# Patient Record
Sex: Male | Born: 1990
Health system: Southern US, Community
[De-identification: ages and names within clinical notes are randomized; demographics above are authoritative.]

---

## 2015-11-30 ENCOUNTER — Encounter (HOSPITAL_BASED_OUTPATIENT_CLINIC_OR_DEPARTMENT_OTHER): Payer: Self-pay

## 2015-11-30 ENCOUNTER — Emergency Department (HOSPITAL_BASED_OUTPATIENT_CLINIC_OR_DEPARTMENT_OTHER)
Admission: EM | Admit: 2015-11-30 | Discharge: 2015-12-01 | Disposition: A | Discharge status: Home / Self Care | Payer: Self-pay | Attending: Emergency Medicine | Admitting: Emergency Medicine

## 2015-11-30 ENCOUNTER — Emergency Department (HOSPITAL_BASED_OUTPATIENT_CLINIC_OR_DEPARTMENT_OTHER): Payer: Self-pay

## 2015-11-30 DIAGNOSIS — Y92008 Other place in unspecified non-institutional (private) residence as the place of occurrence of the external cause: Secondary | ICD-10-CM | POA: Insufficient documentation

## 2015-11-30 DIAGNOSIS — Y9389 Activity, other specified: Secondary | ICD-10-CM | POA: Insufficient documentation

## 2015-11-30 DIAGNOSIS — F17200 Nicotine dependence, unspecified, uncomplicated: Secondary | ICD-10-CM | POA: Insufficient documentation

## 2015-11-30 DIAGNOSIS — S82831A Other fracture of upper and lower end of right fibula, initial encounter for closed fracture: Secondary | ICD-10-CM | POA: Insufficient documentation

## 2015-11-30 DIAGNOSIS — W1839XA Other fall on same level, initial encounter: Secondary | ICD-10-CM | POA: Insufficient documentation

## 2015-11-30 DIAGNOSIS — Y998 Other external cause status: Secondary | ICD-10-CM | POA: Insufficient documentation

## 2015-11-30 DIAGNOSIS — S82401A Unspecified fracture of shaft of right fibula, initial encounter for closed fracture: Secondary | ICD-10-CM

## 2015-11-30 MED ORDER — OXYCODONE-ACETAMINOPHEN 5-325 MG PO TABS
1.0000 | ORAL_TABLET | Freq: Once | ORAL | Status: AC
  Administered 2015-11-30: 1 via ORAL
  Filled 2015-11-30: qty 1

## 2015-11-30 MED ORDER — OXYCODONE-ACETAMINOPHEN 5-325 MG PO TABS: 2.0000 | ORAL_TABLET | ORAL | Status: DC | PRN

## 2015-11-30 NOTE — Discharge Instructions (Signed)
Fibular Ankle Fracture Treated With or Without Immobilization, Adult A fibular fracture at your ankle is a break (fracture) bone in the smallest of the two bones in your lower leg, located on the outside of your leg (fibula) close to the area at your ankle joint. CAUSES  Rolling your ankle.  Twisting your ankle.  Extreme flexing or extending of your foot.  Severe force on your ankle as when falling from a distance. RISK FACTORS  Jumping activities.  Participation in sports.  Osteoporosis.  Advanced age.  Previous ankle injuries. SIGNS AND SYMPTOMS  Pain.  Swelling.  Inability to put weight on injured ankle.  Bruising.  Bone deformities at site of injury. DIAGNOSIS  This fracture is diagnosed with the help of an X-ray exam. TREATMENT  If the fractured bone did not move out of place it usually will heal without problems and does casting or splinting. If immobilization is needed for comfort or the fractured bone moved out of place and will not heal properly with immobilization, a cast or splint will be used. HOME CARE INSTRUCTIONS   Apply ice to the area of injury:  Put ice in a plastic bag.  Place a towel between your skin and the bag.  Leave the ice on for 20 minutes, 2-3 times a day.  Use crutches as directed. Resume walking without crutches as directed by your health care provider.  Only take over-the-counter or prescription medicines for pain, discomfort, or fever as directed by your health care provider.  If you have a removable splint or boot, do not remove the boot unless directed by your health care provider. SEEK MEDICAL CARE IF:   You have continued pain or more swelling  The medications do not control the pain. SEEK IMMEDIATE MEDICAL CARE IF:  You develop severe pain in the leg or foot.  Your skin or nails below the injury turn blue or grey or feel cold or numb. MAKE SURE YOU:   Understand these instructions.  Will watch your  condition.  Will get help right away if you are not doing well or get worse.   This information is not intended to replace advice given to you by your health care provider. Make sure you discuss any questions you have with your health care provider.   Document Released: 11/26/2005 Document Revised: 12/17/2014 Document Reviewed: 07/08/2013 Elsevier Interactive Patient Education 2016 [employer]  Fibular Fracture With Rehab The fibula is the smaller of the two lower leg bones and is vulnerable to breaks (fracture). Fibular fractures may go fully through the bone (complete) or partially (incomplete). The bone fragments are rarely out of alignment (displaced fracture). Fibula fractures may occur anywhere along the bone. However, this document only discusses fractures that do not involve a leg joint. Fibular fractures are not often a severe injury because the bone supports only about 17% of the body weight. SYMPTOMS   Moderate to severe pain in the lower leg.  Tenderness and swelling in the leg or calf.  Bleeding and/or bruising (contusion) in the leg.  Inability to bear weight on the injured extremity.  Visible deformity, if the fracture is displaced.  Numbness and coldness in the leg and foot, beyond the fracture site, if blood supply is impaired. CAUSES  Fractures occur when a force is placed on the bone that is greater than it can withstand. Common causes of fibular fracture include:  Direct hit (trauma) (i.e., hockey or lacrosse check to the lower leg).  Stress fracture (weakening of  the bone from repeated stress).  Indirect injury, caused by twisting, turning quickly, or violent muscle contraction. RISK INCREASES WITH:  Contact sports (i.e., football, soccer, lacrosse, hockey).  Sports that can cause twisted ankle injury (i.e., skiing, basketball).  Bony abnormalities (i.e., osteoporosis or bone tumors).  Metabolism disorders, hormone problems, and nutrition deficiency and  disorders (i.e., anorexia and bulimia).  Poor strength and flexibility. PREVENTION   Warm up and stretch properly before activity.  Maintain physical fitness:  Strength, flexibility, and endurance.  Cardiovascular fitness.  Wear properly fitted and padded protective equipment (i.e., shin guards for soccer). PROGNOSIS  If treated properly, fibular fractures usually heal in 4 to 6 weeks.  RELATED COMPLICATIONS   Failure of bone to heal (nonunion).  Bone heals in a poor position (malunion).  Increased pressure inside the leg (compartment syndrome) due to injury that disrupts the blood supply to the leg and foot and injures the nerves and muscles (uncommon).  Shortening of the injured bones.  Hindrance of normal bone growth in children.  Risks of surgery: infection, bleeding, injury to nerves (numbness, weakness, paralysis), need for further surgery.  Longer healing time if activity is resumed too soon. TREATMENT Treatment first involves ice, medicine, and elevation of the leg to reduce pain and inflammation. People with fibular fractures are advised to walk using crutches. A brace or walking boot may be given to restrain the injured leg and allow for healing. Sometimes, surgery is needed to place a rod, plate, or screws in the bones in order to fix the fracture. After surgery, the leg is restrained. After restraint (with or without surgery), it is important to complete strengthening and stretching exercises to regain strength and a full range of motion. Exercises may be completed at home or with a therapist. MEDICATION   If pain medicine is needed, nonsteroidal anti-inflammatory medicines (aspirin and ibuprofen), or other minor pain relievers (acetaminophen), are often advised.  Do not take pain medicine for 7 days before surgery.  Prescription pain relievers may be given if your health care provider thinks they are needed. Use only as directed and only as much as you need. SEEK  MEDICAL CARE IF:  Symptoms get worse or do not improve in 2 weeks, despite treatment.  The following occur after restraint or surgery. (Report any of these signs immediately):  Swelling above or below the fracture site.  Severe, persistent pain.  Blue or gray skin below the fracture site, especially under the toenails. Numbness or loss of feeling below the fracture site.  New, unexplained symptoms develop. (Drugs used in treatment may produce side effects.) EXERCISES  RANGE OF MOTION (ROM) AND STRETCHING EXERCISES - Fibular Fracture These exercises may help you when beginning to recover from your injury. Your symptoms may go away with or without further involvement from your physician, physical therapist or athletic trainer. While completing these exercises, remember:   Restoring tissue flexibility helps normal motion to return to the joints. This allows healthier, less painful movement and activity.  An effective stretch should be held for at least 30 seconds.  A stretch should never be painful. You should only feel a gentle lengthening or release in the stretched tissue. RANGE OF MOTION - Dorsi/Plantar Flexion  While sitting with your right / left knee straight, draw the top of your foot upwards by flexing your ankle. Then reverse the motion, pointing your toes downward.  Hold each position for __________ seconds.  After completing your first set of exercises, repeat this exercise with  your knee bent. Repeat __________ times. Complete this exercise __________ times per day.  STRETCH - Gastrocsoleus   Sit with your right / left leg extended. Holding onto both ends of a belt or towel, loop it around the ball of your foot.  Keeping your right / left ankle and foot relaxed and your knee straight, pull your foot and ankle toward you using the belt.  You should feel a gentle stretch behind your calf or knee. Hold this position for __________ seconds. Repeat __________ times. Complete  this stretch __________ times per day.  RANGE OF MOTION- Ankle Plantar Flexion   Sit with your right / left leg crossed over your opposite knee.  Use your opposite hand to pull the top of your foot and toes toward you.  You should feel a gentle stretch on the top of your foot and ankle. Hold this position for __________ seconds. Repeat __________ times. Complete __________ times per day.  RANGE OF MOTION - Ankle Eversion  Sit with your right / left ankle crossed over your opposite knee.  Grip your foot with your opposite hand, placing your thumb on the top of your foot and your fingers across the bottom of your foot.  Gently push your foot downward with a slight rotation so your littlest toes rise slightly toward the ceiling.  You should feel a gentle stretch on the inside of your ankle. Hold the stretch for __________ seconds. Repeat __________ times. Complete this exercise __________ times per day.  RANGE OF MOTION - Ankle Inversion  Sit with your right / left ankle crossed over your opposite knee.  Grip your foot with your opposite hand, placing your thumb on the bottom of your foot and your fingers across the top of your foot.  Gently pull your foot so the smallest toe comes toward you and your thumb pushes the inside of the ball of your foot away from you.  You should feel a gentle stretch on the outside of your ankle. Hold the stretch for __________ seconds. Repeat __________ times. Complete this exercise __________ times per day.  RANGE OF MOTION - Ankle Alphabet  Imagine your right / left big toe is a pen.  Keeping your hip and knee still, write out the entire alphabet with your "pen." Make the letters as large as you can, without increasing any discomfort. Repeat __________ times. Complete this exercise __________ times per day.  RANGE OF MOTION - Ankle Dorsiflexion, Active Assisted  Remove your shoes and sit on a chair, preferably not on a carpeted surface.  Place  your right / left foot on the floor, directly under your knee. Extend your opposite leg for support.  Keeping your heel down, slide your right / left foot back toward the chair, until you feel a stretch at your ankle or calf. If you do not feel a stretch, slide your bottom forward to the edge of the chair, while still keeping your heel down.  Hold this stretch for __________ seconds. Repeat __________ times. Complete this stretch __________ times per day.  STRENGTHENING EXERCISES - Fibular Fracture These exercises may help you when beginning to recover from your injury. They may resolve your symptoms with or without further involvement from your physician, physical therapist or athletic trainer. While completing these exercises, remember:   Muscles can gain both the endurance and the strength needed for everyday activities through controlled exercises.  Complete these exercises as instructed by your physician, physical therapist or athletic trainer. Increase the resistance  and repetitions only as guided.  You may experience muscle soreness or fatigue, but the pain or discomfort you are trying to eliminate should never worsen during these exercises. If this pain does get worse, stop and make certain you are following the directions exactly. If the pain is still present after adjustments, discontinue the exercise until you can discuss the trouble with your clinician. STRENGTH - Dorsiflexors  Secure a rubber exercise band or tubing to a fixed object (table, pole) and loop the other end around your right / left foot.  Sit on the floor, facing the fixed object. The band should be slightly tense when your foot is relaxed.  Slowly draw your foot back toward you, using your ankle and toes.  Hold this position for __________ seconds. Slowly release the tension in the band and return your foot to the starting position. Repeat __________ times. Complete this exercise __________ times per day.  STRENGTH  - Plantar-flexors  Sit with your right / left leg extended. Holding onto both ends of a rubber exercise band or tubing, loop it around the ball of your foot. Keep a slight tension in the band.  Slowly push your toes away from you, pointing them downward.  Hold this position for __________ seconds. Return to the starting position slowly, controlling the tension in the band. Repeat __________ times. Complete this exercise __________ times per day.  STRENGTH - Plantar-flexors, Standing   Stand with your feet shoulder width apart. Place your hands on a wall or table to steady yourself, using as little support as needed.  Keeping your weight evenly spread over the width of your feet, rise up on your toes.*  Hold this position for __________ seconds. Repeat __________ times. Complete this exercise __________ times per day.  *If this is too easy, shift your weight toward your right / left leg until you feel challenged. Ultimately, you may be asked to do this exercise while standing on your right / left foot only. STRENGTH - Towel Curls  Sit in a chair, on a non-carpeted surface.  Place your foot on a towel, keeping your heel on the floor.  Pull the towel toward your heel only by curling your toes. Keep your heel on the floor.  If instructed by your physician, physical therapist or athletic trainer, add ____________________ at the end of the towel. Repeat __________ times. Complete this exercise __________ times per day. STRENGTH - Ankle Eversion  Secure one end of a rubber exercise band or tubing to a fixed object (table, pole). Loop the other end around your foot, just before your toes.  Place your fists between your knees. This will focus your strengthening at your ankle.  Drawing the band across your opposite foot, away from the pole, slowly pull your little toe out and up. Make sure the band is positioned to resist the entire motion.  Hold this position for __________  seconds.  Return to the starting position slowly, controlling the tension in the band. Repeat __________ times. Complete this exercise __________ times per day.  STRENGTH - Ankle Inversion  Secure one end of a rubber exercise band or tubing to a fixed object (table, pole). Loop the other end around your foot, just before your toes.  Place your fists between your knees. This will focus your strengthening at your ankle.  Slowly, pull your big toe up and in, making sure the band is positioned to resist the entire motion.  Hold this position for __________ seconds.  Return to  the starting position slowly, controlling the tension in the band. Repeat __________ times. Complete this exercises __________ times per day.    This information is not intended to replace advice given to you by your health care provider. Make sure you discuss any questions you have with your health care provider.   Follow-up with orthopedic provider as soon as possible for reevaluation. Do not bear weight on this extremity. Keep leg elevated as much as possible. Apply ice to the affected area. Take ibuprofen for pain and inflammation. Return to the emergency per review experience severe worsening of your pain, discoloration or extremity, numbness or tingling.

## 2015-11-30 NOTE — ED Provider Notes (Signed)
CSN: [id number]     Arrival date & time 11/30/15  1548 History   First MD Initiated Contact with Patient 11/30/15 1632     Chief Complaint  Patient presents with  . Leg Pain     (Consider location/radiation/quality/duration/timing/severity/associated sxs/prior Treatment) HPI   Darryl Pruitt is a 24 y.o M with no significant pmhx who presents the emergency room today complaining of right lower leg pain. Patient states that 2 nights ago he was on his back porch when he pivoted on his right foot to go back inside and he felt immediate onset pain in his right lower extremity and fell to the ground. Patient was initially able to bear weight on this leg but it has become more painful over the last 2 days. He is now unable to bear weight on that leg. Patient reports significant bruising and swelling to his right ankle. Pain is primarily located below his right knee on the lateral aspect of his anterior shin. Patient has been taking Vicoprofen with no relief. Denies numbness or tingling in his extremity, cool or pale extremity, weakness.   History reviewed. No pertinent past medical history. History reviewed. No pertinent past surgical history. No family history on file. Social History  Substance Use Topics  . Smoking status: Current Every Day Smoker  . Smokeless tobacco: None  . Alcohol Use: Yes     Comment: social    Review of Systems  All other systems reviewed and are negative.     Allergies  Review of patient's allergies indicates no known allergies.  Home Medications   Prior to Admission medications   Medication Sig Start Date End Date Taking? Authorizing Provider  oxyCODONE-acetaminophen (PERCOCET/ROXICET) 5-325 MG tablet Take 2 tablets by mouth every 4 (four) hours as needed for severe pain. 11/30/15   Kayla Deshaies Tripp Ireanna Finlayson, PA-C   BP 123/80 mmHg  Pulse 68  Temp(Src) 98.4 F (36.9 C) (Oral)  Resp 18  Ht 5\' 7"  (1.702 m)  Wt 68.04 kg  BMI 23.49 kg/m2  SpO2  100% Physical Exam  Constitutional: He is oriented to person, place, and time. He appears well-developed and well-nourished. No distress.  HENT:  Head: Normocephalic and atraumatic.  Eyes: Conjunctivae are normal. Right eye exhibits no discharge. Left eye exhibits no discharge. No scleral icterus.  Cardiovascular: Normal rate and intact distal pulses.   Pulmonary/Chest: Effort normal.  Musculoskeletal:       Right lower leg: He exhibits tenderness and swelling.       Legs: Significant edema and ecchymosis over right lateral malleolus. Exquisite TTP over anterior lateral aspect of right shin. Pain felt with dorsi and plantarflexion of right foot.  Neurological: He is alert and oriented to person, place, and time. He exhibits normal muscle tone. Coordination normal.  Strength 5/5 throughout. No sensory deficits.    Skin: Skin is warm and dry. No rash noted. He is not diaphoretic. No erythema. No pallor.  Psychiatric: He has a normal mood and affect. His behavior is normal.  Nursing note and vitals reviewed.   ED Course  Procedures (including critical care time) Labs Review Labs Reviewed - No data to display  Imaging Review Dg Tibia/fibula Right  11/30/2015  CLINICAL DATA:  Injured right ankle and leg 2 days ago. EXAM: RIGHT ANKLE - COMPLETE 3+ VIEW; RIGHT TIBIA AND FIBULA - 2 VIEW COMPARISON:  None. FINDINGS: Right tibia/ fibula: There is a nondisplaced oblique fracture involving the proximal fibular shaft. The tibia is intact. The knee joint is  normal. Right ankle: The ankle mortise is maintained. No acute ankle fracture or osteochondral abnormality. No joint effusion. The visualized mid and hindfoot bony structures are intact. IMPRESSION: Proximal fibular shaft fracture. No ankle fracture. Electronically Signed   By: Rudie Meyer M.D.   On: 11/30/2015 18:15   Dg Ankle Complete Right  11/30/2015  CLINICAL DATA:  Injured right ankle and leg 2 days ago. EXAM: RIGHT ANKLE - COMPLETE 3+  VIEW; RIGHT TIBIA AND FIBULA - 2 VIEW COMPARISON:  None. FINDINGS: Right tibia/ fibula: There is a nondisplaced oblique fracture involving the proximal fibular shaft. The tibia is intact. The knee joint is normal. Right ankle: The ankle mortise is maintained. No acute ankle fracture or osteochondral abnormality. No joint effusion. The visualized mid and hindfoot bony structures are intact. IMPRESSION: Proximal fibular shaft fracture. No ankle fracture. Electronically Signed   By: Rudie Meyer M.D.   On: 11/30/2015 18:15   I have personally reviewed and evaluated these images and lab results as part of my medical decision-making.   EKG Interpretation None      MDM   Final diagnoses:  Fibula fracture, right, closed, initial encounter    Pt with R proximal fibular shaft fracture. No ankle fracture. Neurovascularly intact. Will place pt in knee immobilizer as this will stabilize his fibular fracture, and give crutches. Non-weight-bearing. F/u with orthopedic. Recommend RICE precautions and ibuprofen. Will give short course of pain medication.     Lester Kinsman Coin, PA-C 11/30/15 2221  Rolland Porter, MD 12/13/15 [phone]

## 2015-11-30 NOTE — ED Notes (Signed)
C/o pain to right LE x 2 days with no known injury

## 2015-12-07 ENCOUNTER — Ambulatory Visit (INDEPENDENT_AMBULATORY_CARE_PROVIDER_SITE_OTHER): Payer: Self-pay | Admitting: Family Medicine

## 2015-12-07 ENCOUNTER — Encounter: Payer: Self-pay | Admitting: Family Medicine

## 2015-12-07 VITALS — BP 114/72 | HR 82 | Ht 70.0 in | Wt 150.0 lb

## 2015-12-07 DIAGNOSIS — S82401A Unspecified fracture of shaft of right fibula, initial encounter for closed fracture: Secondary | ICD-10-CM

## 2015-12-07 MED ORDER — IBUPROFEN 800 MG PO TABS: 800.0000 mg | ORAL_TABLET | Freq: Three times a day (TID) | ORAL | Status: AC | PRN

## 2015-12-07 MED ORDER — HYDROCODONE-ACETAMINOPHEN 10-325 MG PO TABS: 1.0000 | ORAL_TABLET | Freq: Four times a day (QID) | ORAL | Status: DC | PRN

## 2015-12-07 NOTE — Patient Instructions (Signed)
You have a fibular shaft fracture. Wear immobilizer at all times except to ice and wash the area. Elevate above your heart when possible. Icing 15 minutes at a time 3-4 times a day. Ibuprofen 800mg  three times a day with food. Norco as needed for severe pain (no driving on this medication). Follow up with me in 1 week for reevaluation. If you develop numbness in your foot, pain worsens instead of improves over the next several days, call us.

## 2015-12-09 DIAGNOSIS — S82401A Unspecified fracture of shaft of right fibula, initial encounter for closed fracture: Secondary | ICD-10-CM | POA: Insufficient documentation

## 2015-12-09 NOTE — Progress Notes (Signed)
PCP: No primary care provider on file.  Subjective:   HPI: Patient is a 24 y.o. male here for right leg injury.  Patient reports on 12/19 he turned to open the door off back deck, fell down to his knees. Severe pain lateral right lower leg. Swelling occurred down into ankle and foot. Has had a lot of pain in lower leg and calf. No swelling, redness, warmth of calf. No tingling or numbness, weakness of lower leg. Pain level 7/10, sharp. Wearing knee immobilizer. Then 2 days ago he was improving and fell down 2-3 steps with immobilizer on - thinks this contributed to current pain. Taking pain medicine, ibuprofen.  No past medical history on file.  Current Outpatient Prescriptions on File Prior to Visit  Medication Sig Dispense Refill  . oxyCODONE-acetaminophen (PERCOCET/ROXICET) 5-325 MG tablet Take 2 tablets by mouth every 4 (four) hours as needed for severe pain. 10 tablet 0   No current facility-administered medications on file prior to visit.    No past surgical history on file.  No Known Allergies  Social History   Social History  . Marital Status: Single    Spouse Name: N/A  . Number of Children: N/A  . Years of Education: N/A   Occupational History  . Not on file.   Social History Main Topics  . Smoking status: Current Every Day Smoker  . Smokeless tobacco: Not on file  . Alcohol Use: 0.0 oz/week    0 Standard drinks or equivalent per week     Comment: social  . Drug Use: No  . Sexual Activity: Not on file   Other Topics Concern  . Not on file   Social History Narrative    No family history on file.  BP 114/72 mmHg  Pulse 82  Ht 5\' 10"  (1.778 m)  Wt 150 lb (68.04 kg)  BMI 21.52 kg/m2  Review of Systems: See HPI above.    Objective:  Physical Exam:  Gen: NAD  Right lower leg: Mild swelling lower leg, ankle, foot.  No bruising, warmth, palpable cords.  Calf soft.  TTP proximal fibula and lateral gastroc.  No malleolar, base 5th, deltoid  ligament, navicular, other tenderness. Minimal limitation ankle motions without pain.  5/5 strength dorsiflexion. Thompsons negative. Syndesmotic compression negative. NVI distally.  Sensation intact entire foot to light touch.  Left ankle: FROM without pain.    Assessment & Plan:  1. Right fibular shaft fracture - independently reviewed radiographs.  Ankle joint symmetric, no evidence maisonneuve fracture.  Calf soft, no evidence compartment syndrome based on history, exam.  Will continue with immobilizer.  Icing, ibuprofen with norco as needed.  F/u in 1 week for reevaluation.

## 2015-12-09 NOTE — Assessment & Plan Note (Signed)
independently reviewed radiographs.  Ankle joint symmetric, no evidence maisonneuve fracture.  Calf soft, no evidence compartment syndrome based on history, exam.  Will continue with immobilizer.  Icing, ibuprofen with norco as needed.  F/u in 1 week for reevaluation.

## 2015-12-14 ENCOUNTER — Ambulatory Visit (HOSPITAL_BASED_OUTPATIENT_CLINIC_OR_DEPARTMENT_OTHER)
Admission: RE | Admit: 2015-12-14 | Discharge: 2015-12-14 | Disposition: A | Discharge status: Home / Self Care | Payer: Self-pay | Source: Ambulatory Visit | Attending: Family Medicine | Admitting: Family Medicine

## 2015-12-14 ENCOUNTER — Ambulatory Visit (INDEPENDENT_AMBULATORY_CARE_PROVIDER_SITE_OTHER): Payer: Self-pay | Admitting: Family Medicine

## 2015-12-14 ENCOUNTER — Encounter: Payer: Self-pay | Admitting: Family Medicine

## 2015-12-14 ENCOUNTER — Other Ambulatory Visit: Payer: Self-pay | Admitting: Family Medicine

## 2015-12-14 VITALS — BP 117/73 | HR 82 | Ht 70.0 in | Wt 150.0 lb

## 2015-12-14 DIAGNOSIS — M25571 Pain in right ankle and joints of right foot: Secondary | ICD-10-CM

## 2015-12-14 DIAGNOSIS — S82401D Unspecified fracture of shaft of right fibula, subsequent encounter for closed fracture with routine healing: Secondary | ICD-10-CM

## 2015-12-14 MED ORDER — HYDROCODONE-ACETAMINOPHEN 10-325 MG PO TABS: 1.0000 | ORAL_TABLET | Freq: Four times a day (QID) | ORAL | Status: DC | PRN

## 2015-12-14 NOTE — Patient Instructions (Signed)
Continue with the knee immobilizer and crutches. Ibuprofen three times a day with food. Hydrocodone as needed for severe pain. I would recommend avoiding weight bearing on this until I see you back. Out of work in the meantime. Follow up with me in 2 weeks.

## 2015-12-16 NOTE — Progress Notes (Signed)
PCP: No primary care provider on file.  Subjective:   HPI: Patient is a 25 y.o. male here for right leg injury.  12/07/15: Patient reports on 12/19 he turned to open the door off back deck, fell down to his knees. Severe pain lateral right lower leg. Swelling occurred down into ankle and foot. Has had a lot of pain in lower leg and calf. No swelling, redness, warmth of calf. No tingling or numbness, weakness of lower leg. Pain level 7/10, sharp. Wearing knee immobilizer. Then 2 days ago he was improving and fell down 2-3 steps with immobilizer on - thinks this contributed to current pain. Taking pain medicine, ibuprofen.  12/14/15: Patient reports pain has improved since last visit - down to 6/10 level. Taking ibuprofen and hydrocodone. Using immobilizer. Difficulty sleeping at night.  No past medical history on file.  Current Outpatient Prescriptions on File Prior to Visit  Medication Sig Dispense Refill  . ibuprofen (ADVIL,MOTRIN) 800 MG tablet Take 1 tablet (800 mg total) by mouth every 8 (eight) hours as needed. 90 tablet 1   No current facility-administered medications on file prior to visit.    No past surgical history on file.  No Known Allergies  Social History   Social History  . Marital Status: Single    Spouse Name: N/A  . Number of Children: N/A  . Years of Education: N/A   Occupational History  . Not on file.   Social History Main Topics  . Smoking status: Current Every Day Smoker  . Smokeless tobacco: Not on file  . Alcohol Use: 0.0 oz/week    0 Standard drinks or equivalent per week     Comment: social  . Drug Use: No  . Sexual Activity: Not on file   Other Topics Concern  . Not on file   Social History Narrative    No family history on file.  BP 117/73 mmHg  Pulse 82  Ht 5\' 10"  (1.778 m)  Wt 150 lb (68.04 kg)  BMI 21.52 kg/m2  Review of Systems: See HPI above.    Objective:  Physical Exam:  Gen: NAD  Right lower leg: Mild  swelling lower leg, ankle, foot.  No bruising, warmth, palpable cords.  Calf soft.  TTP proximal fibula and lateral gastroc.  Mild medial tenderness now over deltoid ligament.  No malleolar, base 5th, navicular, other tenderness. Minimal limitation ankle motions without pain.  5/5 strength dorsiflexion. Thompsons negative. Syndesmotic compression negative. NVI distally.  Sensation intact entire foot to light touch.  Left ankle: FROM without pain.    Assessment & Plan:  1. Right fibular shaft fracture - independently reviewed radiographs - performed medial stress view today.  Ankle joint symmetric, no evidence maisonneuve fracture.  Calf soft, no evidence compartment syndrome based on history, exam.  Will continue with immobilizer.  Icing, ibuprofen with norco as needed.  Avoid weight bearing.  F/u in 2 weeks for reevaluation.

## 2015-12-16 NOTE — Assessment & Plan Note (Signed)
independently reviewed radiographs - performed medial stress view today.  Ankle joint symmetric, no evidence maisonneuve fracture.  Calf soft, no evidence compartment syndrome based on history, exam.  Will continue with immobilizer.  Icing, ibuprofen with norco as needed.  Avoid weight bearing.  F/u in 2 weeks for reevaluation.

## 2015-12-28 ENCOUNTER — Ambulatory Visit: Payer: Self-pay | Admitting: Family Medicine

## 2015-12-30 ENCOUNTER — Encounter: Payer: Self-pay | Admitting: Family Medicine

## 2015-12-30 ENCOUNTER — Ambulatory Visit (HOSPITAL_BASED_OUTPATIENT_CLINIC_OR_DEPARTMENT_OTHER)
Admission: RE | Admit: 2015-12-30 | Discharge: 2015-12-30 | Disposition: A | Discharge status: Home / Self Care | Payer: Self-pay | Source: Ambulatory Visit | Attending: Family Medicine | Admitting: Family Medicine

## 2015-12-30 ENCOUNTER — Ambulatory Visit (INDEPENDENT_AMBULATORY_CARE_PROVIDER_SITE_OTHER): Payer: Self-pay | Admitting: Family Medicine

## 2015-12-30 VITALS — BP 114/81 | HR 86 | Ht 70.0 in | Wt 150.0 lb

## 2015-12-30 DIAGNOSIS — S82831D Other fracture of upper and lower end of right fibula, subsequent encounter for closed fracture with routine healing: Secondary | ICD-10-CM

## 2015-12-30 DIAGNOSIS — S82401D Unspecified fracture of shaft of right fibula, subsequent encounter for closed fracture with routine healing: Secondary | ICD-10-CM | POA: Insufficient documentation

## 2015-12-30 DIAGNOSIS — I82401 Acute embolism and thrombosis of unspecified deep veins of right lower extremity: Secondary | ICD-10-CM | POA: Insufficient documentation

## 2015-12-30 DIAGNOSIS — X58XXXD Exposure to other specified factors, subsequent encounter: Secondary | ICD-10-CM | POA: Insufficient documentation

## 2015-12-30 MED ORDER — CYCLOBENZAPRINE HCL 10 MG PO TABS: 10.0000 mg | ORAL_TABLET | Freq: Three times a day (TID) | ORAL | Status: AC | PRN

## 2015-12-30 MED ORDER — HYDROCODONE-ACETAMINOPHEN 10-325 MG PO TABS: 1.0000 | ORAL_TABLET | Freq: Four times a day (QID) | ORAL | Status: AC | PRN

## 2015-12-30 MED FILL — HYDROCODON-APAP 10-325: 10-325 | 10 days supply | Qty: 40 | Fill #0

## 2015-12-30 MED FILL — CYCLOBENZAPRINE 10 MG TAB: 10 | 40 days supply | Qty: 120 | Fill #0

## 2015-12-30 NOTE — Patient Instructions (Addendum)
You have enough callus at this point that we can discontinue the immobilizer and the crutches unless you feel you need these. Ibuprofen three times a day with food. Hydrocodone as needed for severe pain. Flexeril as needed for spasms. Get the ultrasound downstairs today. Out of work at least 1 more week. Compartment syndrome is a different consideration that we will discuss if testing here is normal though this is very unlikely given your current exam and the bone that you fractured.

## 2016-01-03 NOTE — Assessment & Plan Note (Signed)
independently reviewed repeat radiographs - good callus formation of proximal fibular fracture.  Still with a lot of pain, calf tenderness though (some seems nonanatomic - a lot of withdrawal and pain to light touch).  Doppler u/s negative for DVT as a precaution.  Clinically does not have CRPS or evidence compartment syndrome (latter would be extremely unusual with proximal fibular fracture as well).  Plan to follow up in 3 weeks for reevaluation.  Discontinue immobilizer and crutches at this point.  Norco, flexeril as needed.

## 2016-01-03 NOTE — Progress Notes (Signed)
PCP: No primary care provider on file.  Subjective:   HPI: Patient is a 25 y.o. male here for right leg injury.  12/07/15: Patient reports on 12/19 he turned to open the door off back deck, fell down to his knees. Severe pain lateral right lower leg. Swelling occurred down into ankle and foot. Has had a lot of pain in lower leg and calf. No swelling, redness, warmth of calf. No tingling or numbness, weakness of lower leg. Pain level 7/10, sharp. Wearing knee immobilizer. Then 2 days ago he was improving and fell down 2-3 steps with immobilizer on - thinks this contributed to current pain. Taking pain medicine, ibuprofen.  12/14/15: Patient reports pain has improved since last visit - down to 6/10 level. Taking ibuprofen and hydrocodone. Using immobilizer. Difficulty sleeping at night.  1/20: Patient reports he has improved since last visit but still has 5/10 level of pain. Using immobilizer. Taking norco. Ankle has improved. Reports occasional tingling only in 2nd-4th toes, nothing consistent. No swelling, redness, fever.  No past medical history on file.  Current Outpatient Prescriptions on File Prior to Visit  Medication Sig Dispense Refill  . ibuprofen (ADVIL,MOTRIN) 800 MG tablet Take 1 tablet (800 mg total) by mouth every 8 (eight) hours as needed. 90 tablet 1   No current facility-administered medications on file prior to visit.    No past surgical history on file.  No Known Allergies  Social History   Social History  . Marital Status: Single    Spouse Name: N/A  . Number of Children: N/A  . Years of Education: N/A   Occupational History  . Not on file.   Social History Main Topics  . Smoking status: Current Every Day Smoker  . Smokeless tobacco: Not on file  . Alcohol Use: 0.0 oz/week    0 Standard drinks or equivalent per week     Comment: social  . Drug Use: No  . Sexual Activity: Not on file   Other Topics Concern  . Not on file   Social  History Narrative    No family history on file.  BP 114/81 mmHg  Pulse 86  Ht  (1.778 m)  Wt 150 lb (68.04 kg)  BMI 21.52 kg/m2  Review of Systems: See HPI above.    Objective:  Physical Exam:  Gen: NAD  Right lower leg: No swelling, bruising, deformity.  No palpable cords.   TTP proximal fibula and throughout calf.  No ankle tenderness. Minimal limitation ankle motions without pain.  5/5 strength dorsiflexion. Thompsons negative. Syndesmotic compression negative. NVI distally.   Sensation intact to light touch.  Left ankle: FROM without pain.    Assessment & Plan:  1. Right fibular shaft fracture - independently reviewed repeat radiographs - good callus formation of proximal fibular fracture.  Still with a lot of pain, calf tenderness though (some seems nonanatomic - a lot of withdrawal and pain to light touch).  Doppler u/s negative for DVT as a precaution.  Clinically does not have CRPS or evidence compartment syndrome (latter would be extremely unusual with proximal fibular fracture as well).  Plan to follow up in 3 weeks for reevaluation.  Discontinue immobilizer and crutches at this point.  Norco, flexeril as needed.

## 2016-01-13 ENCOUNTER — Telehealth: Payer: Self-pay | Admitting: Family Medicine

## 2016-01-13 ENCOUNTER — Encounter: Payer: Self-pay | Admitting: Family Medicine

## 2016-01-13 NOTE — Telephone Encounter (Signed)
Note printed.

## 2016-11-12 IMAGING — DX DG ANKLE 2V *R*
1 series · 1 of 1 positions shown · non-contrast
Comparison: 11/30/2015

CLINICAL DATA: History of prior proximal fibular fracture

EXAM:
RIGHT ANKLE -1 VIEW

[ankle ap]
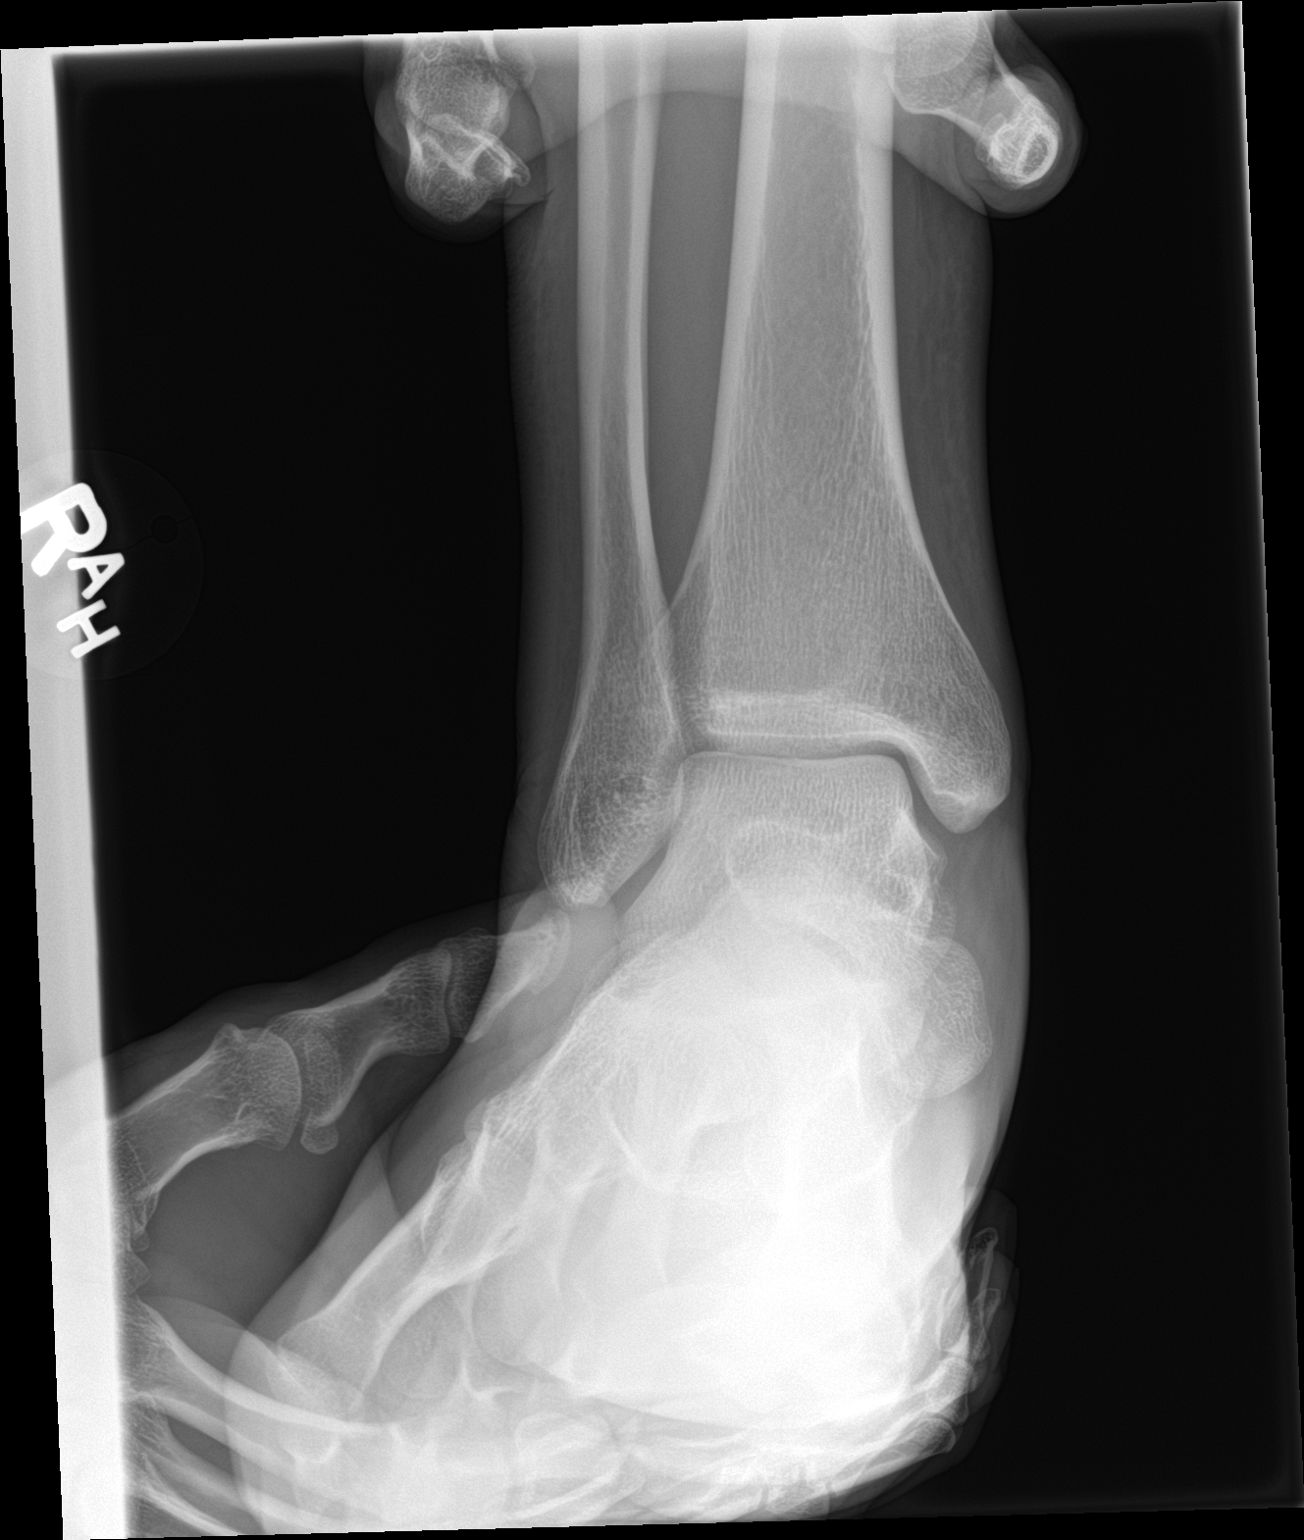

[1 of 1 positions shown; findings below may reference images not displayed]

FINDINGS: Single frontal view of the right ankle was performed and reveals no
acute fracture or dislocation. No gross soft tissue abnormality is
noted.
IMPRESSION: No acute abnormality noted.

## 2017-10-26 IMAGING — US US EXTREM LOW VENOUS*R*
1 series · 13 of 24 positions shown · non-contrast
Comparison: None.

CLINICAL DATA: Fractured right fibula 1 month ago with persistent
pain



[Series 1: us extrem low venous*right* · 0.06mm/px · 13 of 31 slices shown]
[im 1/31]
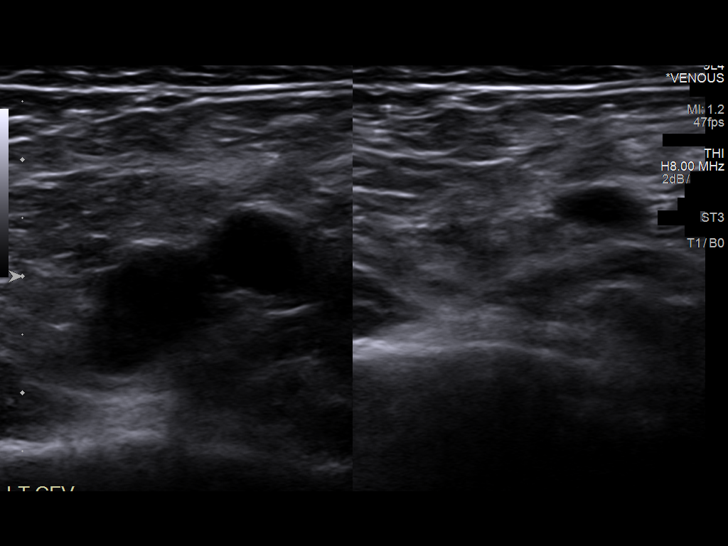
[im 3/31]
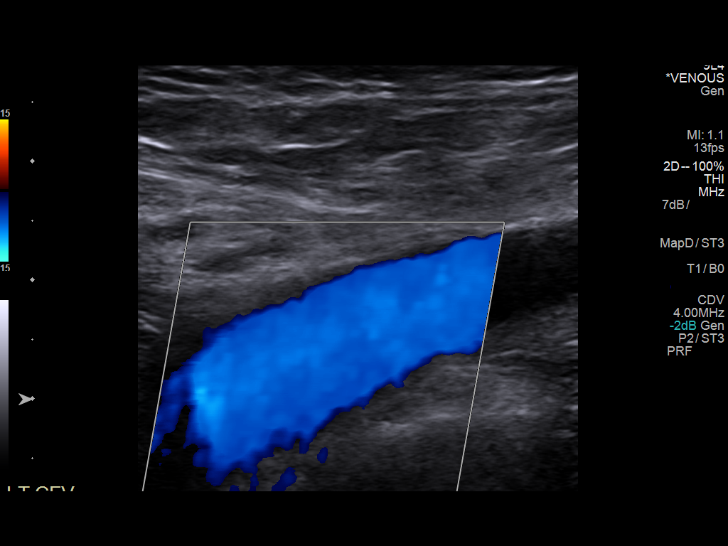
[im 6/31]
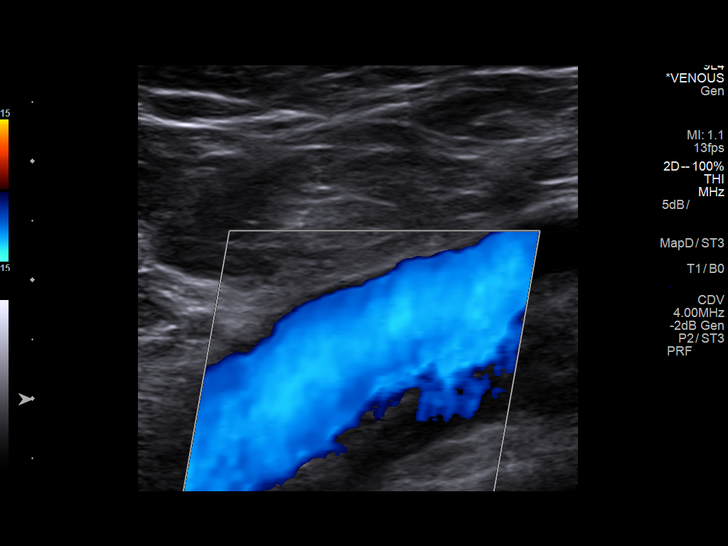
[im 8/31]
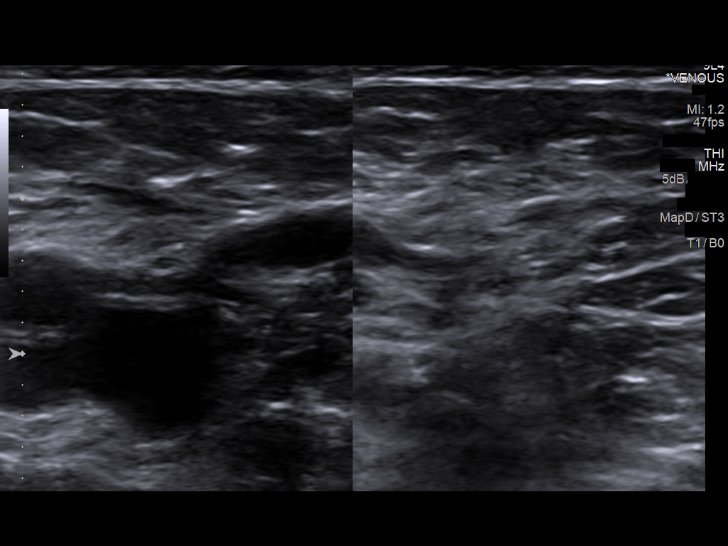
[im 11/31]
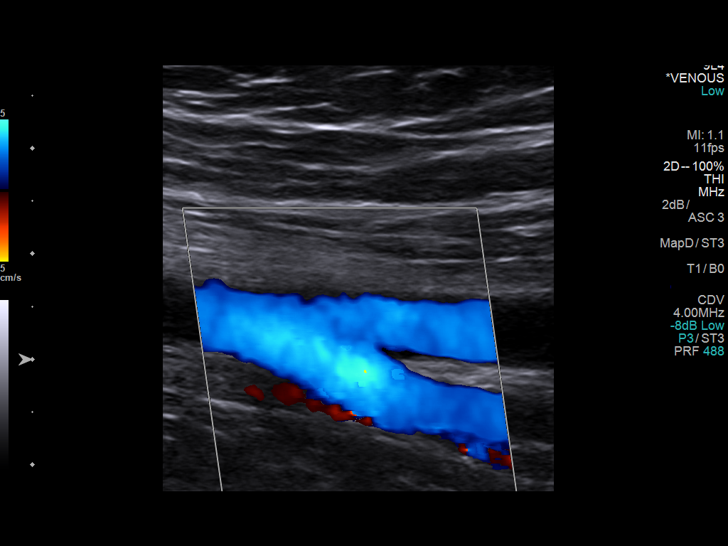
[im 14/31]
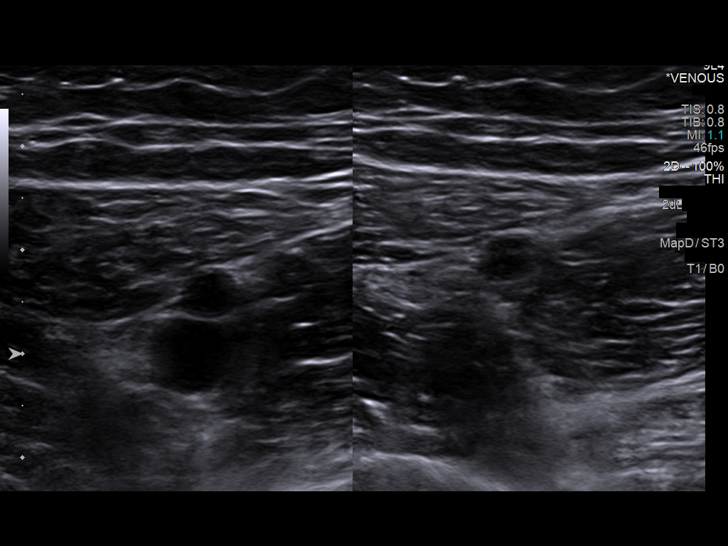
[im 16/31]
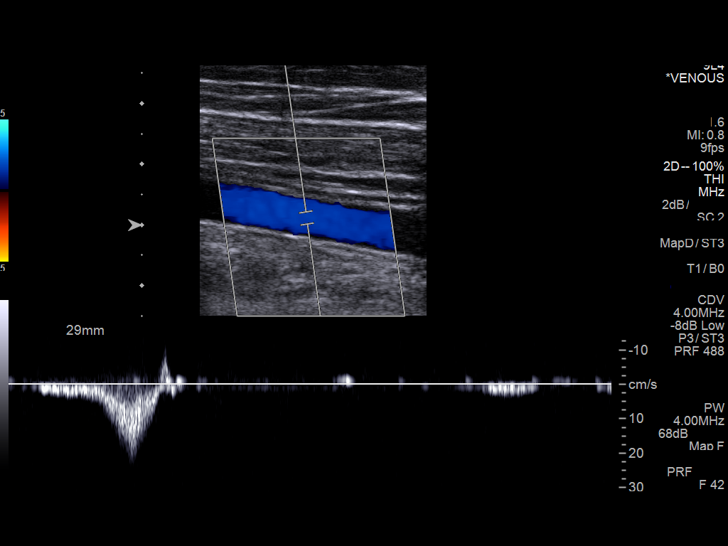
[im 17/31]
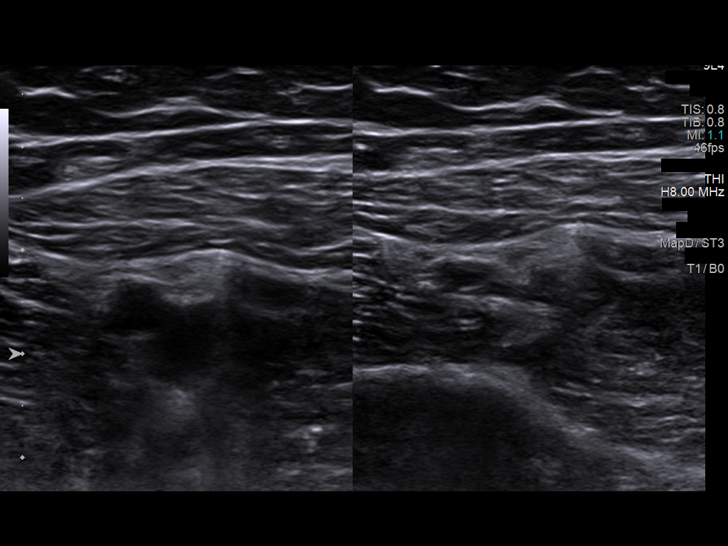
[im 20/31]
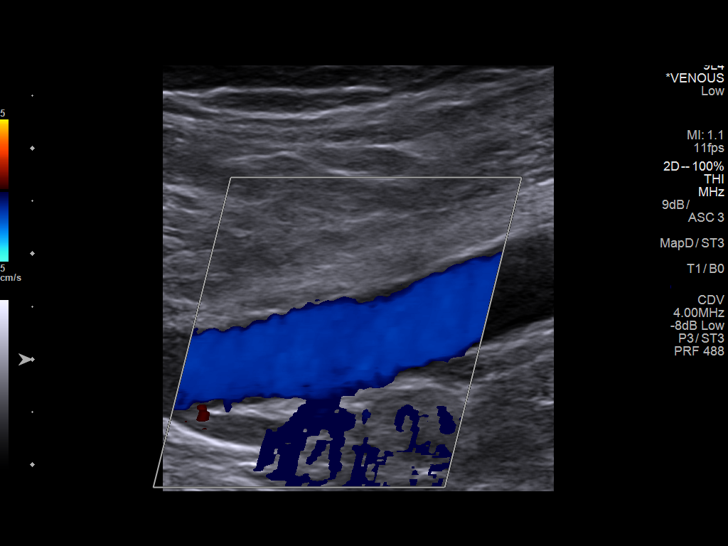
[im 23/31]
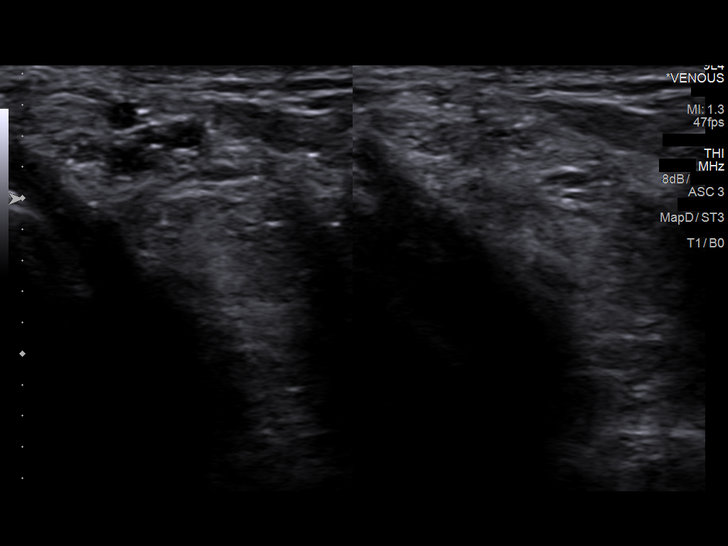
[im 25/31]
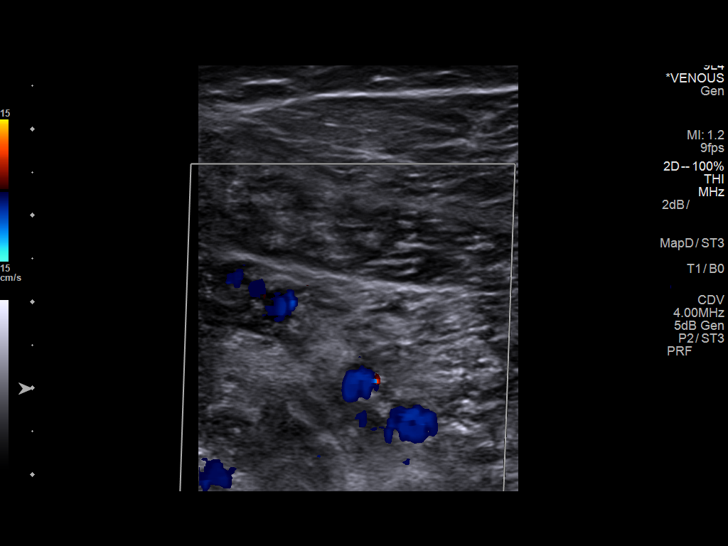
[im 28/31]
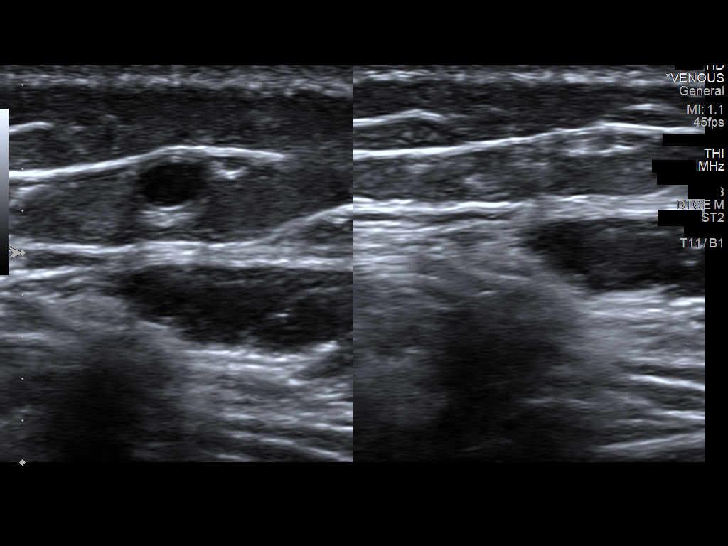
[im 31/31]
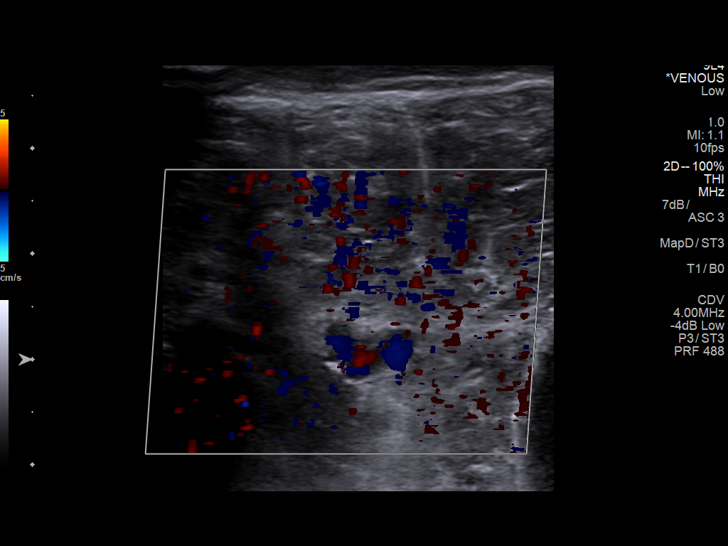

[13 of 24 positions shown; findings below may reference images not displayed]

FINDINGS: Contralateral Common Femoral Vein: Respiratory phasicity is normal
and symmetric with the symptomatic side. No evidence of thrombus.
Normal compressibility.

Common Femoral Vein: No evidence of thrombus.

Saphenofemoral Junction: No evidence of thrombus.

Profunda Femoral Vein: No evidence of thrombus.

Femoral Vein: No evidence of thrombus.

Popliteal Vein: No evidence of thrombus.

Calf Veins: No evidence of thrombus.
IMPRESSION: No evidence of right lower extremity deep venous thrombosis.

## 2023-05-18 ENCOUNTER — Encounter: Payer: Self-pay | Admitting: Physician Assistant

## 2023-05-18 MED ORDER — DOXYCYCLINE HYCLATE 100 MG PO CAPS: 100.0000 mg | ORAL_CAPSULE | Freq: Two times a day (BID) | ORAL | 0 refills | Status: AC

## 2023-11-02 ENCOUNTER — Encounter: Payer: Self-pay | Admitting: Physician Assistant

## 2023-11-02 MED ORDER — AMOXICILLIN 875 MG PO TABS: 875.0000 mg | ORAL_TABLET | Freq: Two times a day (BID) | ORAL | 0 refills | Status: AC

## 2024-09-11 ENCOUNTER — Encounter: Payer: Self-pay | Admitting: Physician Assistant

## 2024-09-11 MED ORDER — PENICILLIN V POTASSIUM 500 MG PO TABS: 500.0000 mg | ORAL_TABLET | Freq: Three times a day (TID) | ORAL | 0 refills | Status: AC
